# Patient Record
Sex: Female | Born: 1980 | Race: White | Hispanic: No | Marital: Single | Smoking: Never smoker
Health system: Southern US, Community
[De-identification: ages and names within clinical notes are randomized; demographics above are authoritative.]

## PROBLEM LIST (undated history)

## (undated) DIAGNOSIS — O99210 Obesity complicating pregnancy, unspecified trimester: Secondary | ICD-10-CM

## (undated) DIAGNOSIS — R87619 Unspecified abnormal cytological findings in specimens from cervix uteri: Secondary | ICD-10-CM

## (undated) DIAGNOSIS — J45909 Unspecified asthma, uncomplicated: Secondary | ICD-10-CM

## (undated) HISTORY — PX: GALLBLADDER SURGERY: SHX652

## (undated) HISTORY — PX: WISDOM TOOTH EXTRACTION: SHX21

## (undated) HISTORY — DX: Obesity complicating pregnancy, unspecified trimester: O99.210

## (undated) HISTORY — PX: DEBRIDMENT OF DECUBITUS ULCER: SHX6276

## (undated) HISTORY — DX: Unspecified abnormal cytological findings in specimens from cervix uteri: R87.619

---

## 2016-04-25 DIAGNOSIS — R87619 Unspecified abnormal cytological findings in specimens from cervix uteri: Secondary | ICD-10-CM

## 2016-04-25 HISTORY — DX: Unspecified abnormal cytological findings in specimens from cervix uteri: R87.619

## 2016-07-12 ENCOUNTER — Encounter: Payer: Self-pay | Admitting: Emergency Medicine

## 2016-07-12 ENCOUNTER — Emergency Department
Admission: EM | Admit: 2016-07-12 | Discharge: 2016-07-12 | Disposition: A | Discharge status: Home / Self Care | Payer: Self-pay | Attending: Emergency Medicine | Admitting: Emergency Medicine

## 2016-07-12 DIAGNOSIS — R112 Nausea with vomiting, unspecified: Secondary | ICD-10-CM

## 2016-07-12 DIAGNOSIS — R531 Weakness: Secondary | ICD-10-CM

## 2016-07-12 DIAGNOSIS — J45909 Unspecified asthma, uncomplicated: Secondary | ICD-10-CM | POA: Insufficient documentation

## 2016-07-12 DIAGNOSIS — N926 Irregular menstruation, unspecified: Secondary | ICD-10-CM | POA: Insufficient documentation

## 2016-07-12 DIAGNOSIS — R42 Dizziness and giddiness: Secondary | ICD-10-CM | POA: Insufficient documentation

## 2016-07-12 HISTORY — DX: Unspecified asthma, uncomplicated: J45.909

## 2016-07-12 LAB — URINALYSIS, COMPLETE (UACMP) WITH MICROSCOPIC
BACTERIA UA: NONE SEEN
BILIRUBIN URINE: NEGATIVE
GLUCOSE, UA: NEGATIVE mg/dL
HGB URINE DIPSTICK: NEGATIVE
Ketones, ur: NEGATIVE mg/dL
LEUKOCYTES UA: NEGATIVE
NITRITE: NEGATIVE
PH: 5 (ref 5.0–8.0)
Protein, ur: NEGATIVE mg/dL
RBC / HPF: NONE SEEN RBC/hpf (ref 0–5)
SPECIFIC GRAVITY, URINE: 1.021 (ref 1.005–1.030)

## 2016-07-12 LAB — COMPREHENSIVE METABOLIC PANEL
ALK PHOS: 34 U/L — AB (ref 38–126)
ALT: 16 U/L (ref 14–54)
AST: 19 U/L (ref 15–41)
Albumin: 3.8 g/dL (ref 3.5–5.0)
Anion gap: 5 (ref 5–15)
BUN: 11 mg/dL (ref 6–20)
CALCIUM: 9.1 mg/dL (ref 8.9–10.3)
CO2: 29 mmol/L (ref 22–32)
CREATININE: 0.65 mg/dL (ref 0.44–1.00)
Chloride: 105 mmol/L (ref 101–111)
Glucose, Bld: 105 mg/dL — ABNORMAL HIGH (ref 65–99)
Potassium: 4.2 mmol/L (ref 3.5–5.1)
Sodium: 139 mmol/L (ref 135–145)
Total Bilirubin: 1.2 mg/dL (ref 0.3–1.2)
Total Protein: 7.2 g/dL (ref 6.5–8.1)

## 2016-07-12 LAB — CBC
HEMATOCRIT: 38.9 % (ref 35.0–47.0)
Hemoglobin: 13.7 g/dL (ref 12.0–16.0)
MCH: 32 pg (ref 26.0–34.0)
MCHC: 35.3 g/dL (ref 32.0–36.0)
MCV: 90.5 fL (ref 80.0–100.0)
PLATELETS: 252 10*3/uL (ref 150–440)
RBC: 4.3 MIL/uL (ref 3.80–5.20)
RDW: 13.8 % (ref 11.5–14.5)
WBC: 9.1 10*3/uL (ref 3.6–11.0)

## 2016-07-12 LAB — HCG, QUANTITATIVE, PREGNANCY: hCG, Beta Chain, Quant, S: <1 m[IU]/mL (ref <5)

## 2016-07-12 MED ORDER — ONDANSETRON 4 MG PO TBDP: 4.0000 mg | ORAL_TABLET | Freq: Three times a day (TID) | ORAL | 0 refills | Status: DC | PRN

## 2016-07-12 MED ORDER — SODIUM CHLORIDE 0.9 % IV BOLUS (SEPSIS)
1000.0000 mL | Freq: Once | INTRAVENOUS | Status: AC
  Administered 2016-07-12: 1000 mL via INTRAVENOUS

## 2016-07-12 MED ORDER — ONDANSETRON HCL 4 MG/2ML IJ SOLN
4.0000 mg | Freq: Once | INTRAMUSCULAR | Status: AC
  Administered 2016-07-12: 4 mg via INTRAVENOUS
  Filled 2016-07-12: qty 2

## 2016-07-12 NOTE — ED Provider Notes (Signed)
[facility] Emergency Department Provider Note  ____________________________________________  Time seen: Approximately 11:07 AM  I have reviewed Darlene triage vital signs and Darlene nursing notes.   HISTORY  Chief Complaint Weakness    HPI Darlene Pena is a 36 y.o. female sexually active without contraception presenting with generalized weakness, lightheadedness, an episode of vomiting, and irregular periods. Darlene Pena reports that she is trying to conceive and has had irregular vaginal bleedingfor Darlene past several weeks. Today, she was driving to work when she had an episode of lightheadedness with mildly blurred vision and a single episode of vomiting. Afterwards, she was able to eat a BLT and drink Sprite without any difficulty. She has also had several days of diarrhea and feeling generally tired. No cough or cold symptoms, fever or chills, urinary symptoms.  She reports a negative pregnancy test at home several days ago.   Past Medical History:  Diagnosis Date  . Asthma     There are no active problems to display for this Pena.   History reviewed. No pertinent surgical history.    Allergies Latex  History reviewed. No pertinent family history.  Social History Social History  Substance Use Topics  . Smoking status: Never Smoker  . Smokeless tobacco: Never Used  . Alcohol use Yes    Review of Systems Constitutional: No fever/chills.As of lightheadedness without dizziness. Negative syncope. Eyes: Blurred vision, now resolved. ENT: No sore throat. No congestion or rhinorrhea. Cardiovascular: Denies chest pain. Denies palpitations. Respiratory: Denies shortness of breath.  No cough. Gastrointestinal: No abdominal pain.  No nausea, no vomiting.  No diarrhea.  No constipation. Genitourinary: Negative for dysuria. Irregular menses. Musculoskeletal: Negative for back pain. Skin: Negative for rash. Neurological: Negative for headaches. No  focal numbness, tingling or weakness.   10-point ROS otherwise negative.  ____________________________________________   PHYSICAL EXAM:  VITAL SIGNS: ED Triage Vitals  Enc Vitals Group     BP 07/12/16 0940 (!) 163/89     Pulse Rate 07/12/16 0940 98     Resp 07/12/16 0940 20     Temp 07/12/16 0940 98.1 F (36.7 C)     Temp Source 07/12/16 0940 Oral     SpO2 07/12/16 0940 100 %     Weight 07/12/16 0941 (!) 500 lb (226.8 kg)     Height 07/12/16 0941 5\' 6"  (1.676 m)     Head Circumference --      Peak Flow --      Pain Score 07/12/16 0941 7     Pain Loc --      Pain Edu? --      Excl. in GC? --     Constitutional: Alert and oriented.Her bili obese and chronically ill appearing but in no acute distress. Answers questions appropriately. Eyes: Conjunctivae are normal.  EOMI. No scleral icterus. Head: Atraumatic. Nose: No congestion/rhinnorhea. Mouth/Throat: Mucous membranes are moist.  Neck: No stridor.  Supple.  No JVD. No mengingismus. Cardiovascular: Normal rate, regular rhythm. No murmurs, rubs or gallops.  Respiratory: Normal respiratory effort.  No accessory muscle use or retractions. Lungs CTAB.  No wheezes, rales or ronchi. Gastrointestinal: Obese. Soft, nontender and nondistended.  No guarding or rebound.  No peritoneal signs. Musculoskeletal: No LE edema. No ttp in Darlene calves or palpable cords.  Negative Homan's sign. Neurologic:  A&Ox3.  Speech is clear.  Face and smile are symmetric.  EOMI.  Moves all extremities well. Skin:  Skin is warm, dry and intact. No rash noted.  Psychiatric: Mood and affect are normal. Speech and behavior are normal.  Normal judgement. ____________________________________________   LABS (all labs ordered are listed, but only abnormal results are displayed)  Labs Reviewed  URINALYSIS, COMPLETE (UACMP) WITH MICROSCOPIC - Abnormal; Notable for Darlene following:       Result Value   Color, Urine YELLOW (*)    APPearance CLEAR (*)    Squamous  Epithelial / LPF 0-5 (*)    All other components within normal limits  COMPREHENSIVE METABOLIC PANEL - Abnormal; Notable for Darlene following:    Glucose, Bld 105 (*)    Alkaline Phosphatase 34 (*)    All other components within normal limits  CBC  HCG, QUANTITATIVE, PREGNANCY   ____________________________________________  EKG  ED ECG REPORT I, Rockne Menghini, Darlene attending physician, personally viewed and interpreted this ECG.   Date: 07/12/2016  EKG Time: 953  Rate: 79  Rhythm: normal sinus rhythm  Axis: normal  Intervals:none  ST&T Change: No STEMI  ____________________________________________  RADIOLOGY  No results found.  ____________________________________________   PROCEDURES  Procedure(s) performed: None  Procedures  Critical Care performed: No ____________________________________________   INITIAL IMPRESSION / ASSESSMENT AND PLAN / ED COURSE  Pertinent labs & imaging results that were available during my care of Darlene Pena were reviewed by me and considered in my medical decision making (see chart for details).  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 We will rule out pregnancy by doing an hCG in her blood, urinalysis to evaluate for UTI, electrolyte abnormality, or anemia. Her EKG is reassuring with no evidence of arrhythmia or ischemia. She may have a GI illness given her recent diarrhea as well, but is tolerating both food and liquid at this time. Plan reevaluation for final disposition.  ____________________________________________  FINAL CLINICAL IMPRESSION(S) / ED DIAGNOSES  Final diagnoses:  Lightheadedness  Generalized weakness  Non-intractable vomiting with nausea, unspecified vomiting type  Irregular menses         NEW MEDICATIONS STARTED DURING THIS VISIT:  New  Prescriptions   ONDANSETRON (ZOFRAN ODT) 4 MG DISINTEGRATING TABLET    Take 1 tablet (4 mg total) by mouth every 8 (eight) hours as needed for nausea or vomiting.      Rockne Menghini, MD 07/12/16 1220

## 2016-07-12 NOTE — ED Triage Notes (Signed)
Pt to ed with c/o weakness and lethargy last night and today.  Pt states "I feel tired for no reason".  Also reports joint aches and dizziness today.  Denies unilateral weakness.

## 2016-07-12 NOTE — ED Notes (Signed)
Pt requesting to leave, MD informed, MD to bedside.

## 2016-07-12 NOTE — Discharge Instructions (Signed)
Please drink plenty of fluid to stay well hydrated and eat small, regular, healthy meals throughout the day.  Please make an appointment with your primary care physician for follow-up. If you continue to have irregular periods, please make an appointment with your gynecologist.  Return to the emergency department if you develop severe pain, lightheadedness or fainting, chest pain, shortness of breath, or any other symptoms concerning to you.

## 2016-09-21 ENCOUNTER — Encounter: Payer: Self-pay | Admitting: Emergency Medicine

## 2016-09-21 ENCOUNTER — Emergency Department: Payer: Self-pay

## 2016-09-21 ENCOUNTER — Emergency Department
Admission: EM | Admit: 2016-09-21 | Discharge: 2016-09-21 | Disposition: A | Discharge status: Home / Self Care | Payer: Self-pay | Attending: Emergency Medicine | Admitting: Emergency Medicine

## 2016-09-21 DIAGNOSIS — O039 Complete or unspecified spontaneous abortion without complication: Secondary | ICD-10-CM | POA: Insufficient documentation

## 2016-09-21 DIAGNOSIS — J45909 Unspecified asthma, uncomplicated: Secondary | ICD-10-CM | POA: Insufficient documentation

## 2016-09-21 LAB — URINALYSIS, COMPLETE (UACMP) WITH MICROSCOPIC
Bacteria, UA: NONE SEEN
Bilirubin Urine: NEGATIVE
GLUCOSE, UA: NEGATIVE mg/dL
Ketones, ur: NEGATIVE mg/dL
Leukocytes, UA: NEGATIVE
Nitrite: NEGATIVE
PROTEIN: NEGATIVE mg/dL
SPECIFIC GRAVITY, URINE: 1.023 (ref 1.005–1.030)
pH: 5 (ref 5.0–8.0)

## 2016-09-21 LAB — HCG, QUANTITATIVE, PREGNANCY: hCG, Beta Chain, Quant, S: 20249 m[IU]/mL — ABNORMAL HIGH (ref <5)

## 2016-09-21 NOTE — ED Notes (Signed)
Patient reports mild spotting this am at approx 0200. 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 Patient states previous miscarriages caused pain and cramping, but were 12 years ago. Patient denies any current pain/cramping. States she is positive that bleeding is not rectal, though noticed bleeding when she was urinating (unsure of whether it is vaginal or from urine for certain).

## 2016-09-21 NOTE — ED Triage Notes (Signed)
Patient presents to the ED with vaginal spotting.  Patient states she is approx. 5-[redacted] weeks pregnant based on her most recent Ultrasound. Patient denies any abdominal cramping.  Patient states, "I've had a miscarriage before, this feels different but I wanted to get it checked out anyway."  Patient is in no obvious distress at this time.

## 2016-09-21 NOTE — ED Provider Notes (Signed)
[facility] Emergency Department Provider Note       Time seen: ----------------------------------------- 7:53 AM on 09/21/2016 -----------------------------------------     I have reviewed the triage vital signs and the nursing notes.   HISTORY   Chief Complaint Vaginal Bleeding    HPI Darlene Pena is a 36 y.o. female who presents to the ED for vaginal spotting. Patient reports she is 5-[redacted] weeks pregnant based on her most recent ultrasound. She denies any abdominal cramping. She states she's had a miscarriage before and this feels different but she was to be checked anyway. Patient denies any pain currently.   Past Medical History:  Diagnosis Date  . Asthma     There are no active problems to display for this patient.   Past Surgical History:  Procedure Laterality Date  . CESAREAN SECTION     x3    Allergies Latex  Social History Social History  Substance Use Topics  . Smoking status: Never Smoker  . Smokeless tobacco: Never Used  . Alcohol use Yes    Review of Systems Constitutional: Negative for fever. Cardiovascular: Negative for chest pain. Respiratory: Negative for shortness of breath. Gastrointestinal: Negative for abdominal pain, vomiting and diarrhea. Genitourinary: Negative for dysuria.Positive for vaginal bleeding Musculoskeletal: Negative for back pain. Skin: Negative for rash. Neurological: Negative for headaches, focal weakness or numbness.  All systems negative/normal/unremarkable except as stated in the HPI  ____________________________________________   PHYSICAL EXAM:  VITAL SIGNS: ED Triage Vitals  Enc Vitals Group     BP 09/21/16 0745 (!) 151/85     Pulse Rate 09/21/16 0745 89     Resp 09/21/16 0745 18     Temp 09/21/16 0745 98.2 F (36.8 C)     Temp Source 09/21/16 0745 Oral     SpO2 09/21/16 0745 100 %     Weight 09/21/16 0745 (!) 476 lb (215.9 kg)     Height 09/21/16 0745 5\' 7"  (1.702 m)    Head Circumference --      Peak Flow --      Pain Score 09/21/16 0744 0     Pain Loc --      Pain Edu? --      Excl. in GC? --     Constitutional: Alert and oriented. Well appearing and in no distress.Extremely morbid obesity Eyes: Conjunctivae are normal. Normal extraocular movements. Cardiovascular: Normal rate, regular rhythm. No murmurs, rubs, or gallops. Respiratory: Normal respiratory effort without tachypnea nor retractions. Breath sounds are clear and equal bilaterally. No wheezes/rales/rhonchi. Gastrointestinal: Soft and nontender. Normal bowel sounds Musculoskeletal: Nontender with normal range of motion in extremities. No lower extremity tenderness nor edema. Neurologic:  Normal speech and language. No gross focal neurologic deficits are appreciated.  Skin:  Skin is warm, dry and intact. No rash noted. Psychiatric: Mood and affect are normal. Speech and behavior are normal.  ____________________________________________  ED COURSE:  Pertinent labs & imaging results that were available during my care of the patient were reviewed by me and considered in my medical decision making (see chart for details). Patient presents for vaginal bleeding and pregnancy, we will assess with labs and imaging as indicated. Clinical Course as of Sep 21 1029  Wed Sep 21, 2016  0906 HCG, Newman NickelsBeta Chain, Quant, S: (!) [id number] [JW]    Clinical Course User Index [JW] Emily FilbertWilliams, Jonathan E, MD   Procedures ____________________________________________   LABS (pertinent positives/negatives)  Labs Reviewed  HCG, QUANTITATIVE, PREGNANCY - Abnormal; Notable for the  following:       Result Value   hCG, Beta Chain, Quant, S 20,249 (*)    All other components within normal limits  URINALYSIS, COMPLETE (UACMP) WITH MICROSCOPIC - Abnormal; Notable for the following:    Color, Urine AMBER (*)    APPearance HAZY (*)    Hgb urine dipstick LARGE (*)    Squamous Epithelial / LPF 6-30 (*)    All other  components within normal limits    RADIOLOGY Images were viewed by me  Pregnancy ultrasound IMPRESSION: Findings meet definitive criteria for failed pregnancy. This follows SRU consensus guidelines: Diagnostic Criteria for Nonviable Pregnancy Early in the First Trimester. [facility] [phone]. ____________________________________________  FINAL ASSESSMENT AND PLAN  Failed pregnancy   Plan: Patient's labs and imaging were dictated above. Patient had presented for some vaginal bleeding with recent outpatient ultrasound. Ultrasound findings today were confirmatory for failed pregnancy. She will likely continue to have cramping and bleeding. She is referred to GYN for outpatient follow-up.   Emily Filbert, MD   Note: This note was generated in part or whole with voice recognition software. Voice recognition is usually quite accurate but there are transcription errors that can and very often do occur. I apologize for any typographical errors that were not detected and corrected.     Emily Filbert, MD 09/21/16 [id number]

## 2016-09-26 ENCOUNTER — Emergency Department
Admission: EM | Admit: 2016-09-26 | Discharge: 2016-09-26 | Disposition: A | Discharge status: Home / Self Care | Payer: Medicaid Other | Attending: Emergency Medicine | Admitting: Emergency Medicine

## 2016-09-26 ENCOUNTER — Emergency Department: Payer: Medicaid Other

## 2016-09-26 ENCOUNTER — Encounter: Payer: Self-pay | Admitting: *Deleted

## 2016-09-26 DIAGNOSIS — Z3A01 Less than 8 weeks gestation of pregnancy: Secondary | ICD-10-CM | POA: Diagnosis not present

## 2016-09-26 DIAGNOSIS — N9489 Other specified conditions associated with female genital organs and menstrual cycle: Secondary | ICD-10-CM | POA: Insufficient documentation

## 2016-09-26 DIAGNOSIS — O034 Incomplete spontaneous abortion without complication: Secondary | ICD-10-CM | POA: Diagnosis not present

## 2016-09-26 DIAGNOSIS — O209 Hemorrhage in early pregnancy, unspecified: Secondary | ICD-10-CM | POA: Diagnosis present

## 2016-09-26 DIAGNOSIS — J45909 Unspecified asthma, uncomplicated: Secondary | ICD-10-CM | POA: Insufficient documentation

## 2016-09-26 LAB — CBC WITH DIFFERENTIAL/PLATELET
Basophils Absolute: 0 10*3/uL (ref 0–0.1)
Basophils Relative: 0 %
Eosinophils Absolute: 0 10*3/uL (ref 0–0.7)
Eosinophils Relative: 0 %
HEMATOCRIT: 39.4 % (ref 35.0–47.0)
HEMOGLOBIN: 13.6 g/dL (ref 12.0–16.0)
LYMPHS ABS: 1.6 10*3/uL (ref 1.0–3.6)
LYMPHS PCT: 11 %
MCH: 31.8 pg (ref 26.0–34.0)
MCHC: 34.6 g/dL (ref 32.0–36.0)
MCV: 92.1 fL (ref 80.0–100.0)
MONO ABS: 0.7 10*3/uL (ref 0.2–0.9)
MONOS PCT: 5 %
NEUTROS ABS: 12.6 10*3/uL — AB (ref 1.4–6.5)
NEUTROS PCT: 84 %
Platelets: 252 10*3/uL (ref 150–440)
RBC: 4.28 MIL/uL (ref 3.80–5.20)
RDW: 13.5 % (ref 11.5–14.5)
WBC: 14.9 10*3/uL — ABNORMAL HIGH (ref 3.6–11.0)

## 2016-09-26 LAB — ABO/RH: ABO/RH(D): O POS

## 2016-09-26 LAB — HCG, QUANTITATIVE, PREGNANCY: hCG, Beta Chain, Quant, S: 9052 m[IU]/mL — ABNORMAL HIGH (ref <5)

## 2016-09-26 MED ORDER — OXYCODONE-ACETAMINOPHEN 5-325 MG PO TABS
2.0000 | ORAL_TABLET | Freq: Once | ORAL | Status: AC
  Administered 2016-09-26: 2 via ORAL

## 2016-09-26 MED ORDER — MORPHINE SULFATE (PF) 4 MG/ML IV SOLN
4.0000 mg | Freq: Once | INTRAVENOUS | Status: AC
  Administered 2016-09-26: 4 mg via INTRAMUSCULAR
  Filled 2016-09-26: qty 1

## 2016-09-26 MED ORDER — OXYCODONE-ACETAMINOPHEN 5-325 MG PO TABS
ORAL_TABLET | ORAL | Status: AC
  Filled 2016-09-26: qty 2

## 2016-09-26 MED ORDER — OXYCODONE-ACETAMINOPHEN 5-325 MG PO TABS: 1.0000 | ORAL_TABLET | ORAL | 0 refills | Status: DC | PRN

## 2016-09-26 MED ORDER — DOCUSATE SODIUM 100 MG PO CAPS: ORAL_CAPSULE | ORAL | 0 refills | Status: DC

## 2016-09-26 NOTE — ED Notes (Addendum)
Patient transported to Ultrasound 

## 2016-09-26 NOTE — ED Notes (Signed)
Pt refused PO medication, states she took 400 mg of tylenol before arrival, states "I want to wait for a shot"

## 2016-09-26 NOTE — ED Notes (Signed)
Dr York Ceriseforbach in room. Pt bleeding through pants. Given new clothes and wipe

## 2016-09-26 NOTE — ED Provider Notes (Signed)
[facility] Emergency Department Provider Note  ____________________________________________   None    (approximate)  I have reviewed the triage vital signs and the nursing notes.   HISTORY  Chief Complaint Vaginal Bleeding    HPI Darlene Pena is a 36 y.o. female [place] P4 Ab 2 at approx [redacted] weeks gestation who presents for evaluation of acute onset yesterday of severe abdominal cramping that are occurring about every minute and accompanied with heavy vaginal bleeding.  She was seen in this emergency department about 5 days ago for abdominal cramping and had an ultrasound that demonstrated early fetal demise.  She did not start having severe abdominal cramping or vaginal bleeding until yesterday.  It has been heavy and in the emergency department exam room she stood up and had a gush of blood that got all over the floor as well as all of her close.  She denies any lightheadedness or dizziness, chest pain, shortness of breath, nausea, vomiting.  She states that the cramping is very severe and she wants a shot to help the pain go away.  She did not follow up with an OB provider after the last ED visit although it appears in the computer that she has been to [facility] in the past.  She has no specific provider locally.Her bleeding and pain are both severe today.Nothing in particular makes the patient's symptoms better nor worse.     Past Medical History:  Diagnosis Date  . Asthma     There are no active problems to display for this patient.   Past Surgical History:  Procedure Laterality Date  . CESAREAN SECTION     x3    Prior to Admission medications   Medication Sig Start Date End Date Taking? Authorizing Provider  albuterol (PROVENTIL HFA;VENTOLIN HFA) 108 (90 Base) MCG/ACT inhaler Inhale into the lungs every 6 (six) hours as needed for wheezing or shortness of breath.    [provider]  docusate sodium (COLACE) 100 MG capsule Take 1 tablet once or  twice daily as needed for constipation while taking narcotic pain medicine 09/26/16   Hinda Kehr, MD  ondansetron (ZOFRAN ODT) 4 MG disintegrating tablet Take 1 tablet (4 mg total) by mouth every 8 (eight) hours as needed for nausea or vomiting. Patient not taking: Reported on 09/21/2016 07/12/16   Eula Listen, MD  oxyCODONE-acetaminophen (ROXICET) 5-325 MG tablet Take 1-2 tablets by mouth every 4 (four) hours as needed for severe pain. 09/26/16   Hinda Kehr, MD    Allergies Latex  History reviewed. No pertinent family history.  Social History Social History  Substance Use Topics  . Smoking status: Never Smoker  . Smokeless tobacco: Never Used  . Alcohol use Yes    Review of Systems Constitutional: No fever/chills Eyes: No visual changes. ENT: No sore throat. Cardiovascular: Denies chest pain. Respiratory: Denies shortness of breath. Gastrointestinal: Lower cramping abdominal pain.  No nausea, no vomiting.  No diarrhea.  No constipation. Genitourinary: Heavy vaginal bleeding starting yesterday, worse today. Negative for dysuria. Musculoskeletal: Negative for neck pain.  Negative for back pain. Integumentary: Negative for rash. Neurological: Negative for headaches, focal weakness or numbness.   ____________________________________________   PHYSICAL EXAM:  VITAL SIGNS: ED Triage Vitals  Enc Vitals Group     BP 09/26/16 1328 (!) 131/99     Pulse Rate 09/26/16 1328 (!) 103     Resp 09/26/16 1328 20     Temp 09/26/16 1328 98 F (36.7 C)  Temp Source 09/26/16 1328 Oral     SpO2 09/26/16 1328 95 %     Weight 09/26/16 1328 (!) 215.9 kg (476 lb)     Height 09/26/16 1328 1.702 m (5' 7")     Head Circumference --      Peak Flow --      Pain Score 09/26/16 1327 10     Pain Loc --      Pain Edu? --      Excl. in [place]? --     Constitutional: Alert and oriented. Generally well appearing but does appear uncomfortable Eyes: Conjunctivae are normal.  Head:  Atraumatic. Nose: No congestion/rhinnorhea. Cardiovascular: Normal rate, regular rhythm. Good peripheral circulation. Grossly normal heart sounds. Respiratory: Normal respiratory effort.  No retractions. Lungs CTAB. Gastrointestinal: Morbid obesity, about 475 lbs.  Soft and nontender.  Genitourinary: Exam limited by habitus.  Copious dark blood present in vagina, suctioned with Yankauer.  In spite of multiple attempts I was unable to fully visualize the cervix but I did remove a number of large clots.  After suctioning and continuing with the speculum exam, though I cannot specifically visualize the cervix, I see a small and relatively slow trickle of blood continuing to be expressed but it is not large volume or a rapid rate of bleeding. Musculoskeletal: No lower extremity tenderness nor edema. No gross deformities of extremities. Neurologic:  Normal speech and language. No gross focal neurologic deficits are appreciated.  Skin:  Skin is warm, dry and intact. No rash noted. Psychiatric: Mood and affect are normal. Speech and behavior are normal.  ____________________________________________   LABS (all labs ordered are listed, but only abnormal results are displayed)  Labs Reviewed  HCG, QUANTITATIVE, PREGNANCY - Abnormal; Notable for the following:       Result Value   hCG, Beta Chain, Quant, S 9,052 (*)    All other components within normal limits  CBC WITH DIFFERENTIAL/PLATELET - Abnormal; Notable for the following:    WBC 14.9 (*)    Neutro Abs 12.6 (*)    All other components within normal limits  ABO/RH   ____________________________________________  EKG  None - EKG not ordered by ED physician ____________________________________________  RADIOLOGY   US Ob Comp < 14 Wks  Result Date: 09/26/2016 CLINICAL DATA:  Heavy vaginal bleeding. Failed intrauterine pregnancy. EXAM: OBSTETRIC <14 WK [place] AND TRANSVAGINAL OB US TECHNIQUE: Both transabdominal and transvaginal ultrasound  examinations were performed for complete evaluation of the gestation as well as the maternal uterus, adnexal regions, and pelvic cul-de-sac. Transvaginal technique was performed to assess early pregnancy. COMPARISON:  09/21/2016 FINDINGS: Intrauterine gestational sac: Single irregular shaped sac now seen in lower uterine segment Yolk sac:  Not Visualized. Embryo:  Not Visualized. MSD: 21  mm   7 w   0  d Subchorionic hemorrhage:  None visualized. Maternal uterus/adnexae: Neither ovary directly visualized, however no adnexal mass or free fluid identified. IMPRESSION: Abnormal appearing intrauterine gestational sac now located in the lower uterine segment, consistent with incomplete spontaneous abortion. Electronically Signed   By: Earle Gell M.D.   On: 09/26/2016 18:07   US Ob Transvaginal  Result Date: 09/26/2016 CLINICAL DATA:  Heavy vaginal bleeding. Failed intrauterine pregnancy. EXAM: OBSTETRIC <14 WK [place] AND TRANSVAGINAL OB US TECHNIQUE: Both transabdominal and transvaginal ultrasound examinations were performed for complete evaluation of the gestation as well as the maternal uterus, adnexal regions, and pelvic cul-de-sac. Transvaginal technique was performed to assess early pregnancy. COMPARISON:  09/21/2016 FINDINGS: Intrauterine gestational  sac: Single irregular shaped sac now seen in lower uterine segment Yolk sac:  Not Visualized. Embryo:  Not Visualized. MSD: 21  mm   7 w   0  d Subchorionic hemorrhage:  None visualized. Maternal uterus/adnexae: Neither ovary directly visualized, however no adnexal mass or free fluid identified. IMPRESSION: Abnormal appearing intrauterine gestational sac now located in the lower uterine segment, consistent with incomplete spontaneous abortion. Electronically Signed   By: Earle Gell M.D.   On: 09/26/2016 18:07    ____________________________________________   PROCEDURES  Critical Care performed: No   Procedure(s) performed:    Procedures   ____________________________________________   INITIAL IMPRESSION / ASSESSMENT AND PLAN / ED COURSE  Pertinent labs & imaging results that were available during my care of the patient were reviewed by me and considered in my medical decision making (see chart for details).  The patient is Rh+ and does not require RhoGAM.  Her hCG has dropped from about 22,002 about 9000.  Her H&H are stable. Pelvic exam limited by body habitus, cannot fully visualize cervix (see exam below).  Will evaluate with ultrasound and reassess.   Clinical Course as of Sep 27 2218  Mon Sep 26, 2016  4562 Ultrasound shows incomplete spontaneous abortion.  Patient has been stable with normal vital signs and no tachycardia.  Continues to bleed a slight amount.  Continues to have what she describes as severe abdominal cramping.  I had a phone conversation with Dr. Amalia Hailey who is on for unassigned OB/GYN.  He recommended not using Cytotec since the patient is already having severe abdominal cramping.  He also encouraged me to encourage her that in the absence of an emergent condition which requires EMS transport, she should follow up at [facility] (where she has previously established care) and because [facility] does not have sufficient capabilities for a patient of her body habitus to safely perform the surgery and anesthesia that may be needed if she requires a D&C.  I passed this along to her but I stressed that if she feels there is an emergent condition for which she needs immediate treatment she should contact 911, but if it is a matter of following up as an outpatient or even going back in by private vehicle for reevaluation, she should try to go directly to [facility] for definitive care.  She states that she understands this.  I will give HER-2 Percocet now, a prescription for additional pain medicine, and strict return precautions.  [CF]    Clinical Course User Index [CF] Hinda Kehr, MD     ____________________________________________  FINAL CLINICAL IMPRESSION(S) / ED DIAGNOSES  Final diagnoses:  Incomplete spontaneous abortion     MEDICATIONS GIVEN DURING THIS VISIT:  Medications  morphine 4 MG/ML injection 4 mg (4 mg Intramuscular Given 09/26/16 1611)  oxyCODONE-acetaminophen (PERCOCET/ROXICET) 5-325 MG per tablet 2 tablet (2 tablets Oral Given 09/26/16 1837)     NEW OUTPATIENT MEDICATIONS STARTED DURING THIS VISIT:  Discharge Medication List as of 09/26/2016  6:48 PM    START taking these medications   Details  docusate sodium (COLACE) 100 MG capsule Take 1 tablet once or twice daily as needed for constipation while taking narcotic pain medicine, Print    oxyCODONE-acetaminophen (ROXICET) 5-325 MG tablet Take 1-2 tablets by mouth every 4 (four) hours as needed for severe pain., Starting Mon 09/26/2016, Print        Discharge Medication List as of 09/26/2016  6:48 PM      Discharge  Medication List as of 09/26/2016  6:48 PM       Note:  This document was prepared using Dragon voice recognition software and may include unintentional dictation errors.    Hinda Kehr, MD 09/26/16 2221

## 2016-09-26 NOTE — ED Triage Notes (Signed)
Pt states she was [redacted] weeks pregnant and was told she was going to have a miscarriage, states bleeding that began yesterday but states increased pain and clots today, pt moaning in triage

## 2016-09-26 NOTE — Discharge Instructions (Signed)
You have been seen in the Emergency Department (ED) for an incomplete spontaneous abortion (a miscarriage).  As we discussed, based on limitations at [facility], the local OB/GYN specialist recommended that you will receive the best possible care if you are able to follow up at [facility].  Please call them tomorrow at the number you have or at either of the numbers above to follow up with any of the available OB/GYN doctors or to visit their same day or walk-in clinic.  If you are experiencing an emergency, please call 911.  If you need follow-up or if you need to be seen in the emergency department but are stable and able to travel by private vehicle, please go directly to the [facility].  As a result of your blood type, you did not receive an injection of medication called Rhogam - please let your OB/Gyn know.  If you develop any other symptoms that concern you (including, but not limited to, persistent vomiting, worsening bleeding, abdominal or pelvic pain, or fever greater than 101), please return immediately to the Emergency Department.

## 2017-04-12 ENCOUNTER — Emergency Department: Payer: Medicaid Other

## 2017-04-12 ENCOUNTER — Encounter: Payer: Self-pay | Admitting: Emergency Medicine

## 2017-04-12 ENCOUNTER — Emergency Department
Admission: EM | Admit: 2017-04-12 | Discharge: 2017-04-12 | Disposition: A | Discharge status: Home / Self Care | Payer: Medicaid Other | Attending: Emergency Medicine | Admitting: Emergency Medicine

## 2017-04-12 DIAGNOSIS — Y92410 Unspecified street and highway as the place of occurrence of the external cause: Secondary | ICD-10-CM | POA: Diagnosis not present

## 2017-04-12 DIAGNOSIS — Y939 Activity, unspecified: Secondary | ICD-10-CM | POA: Insufficient documentation

## 2017-04-12 DIAGNOSIS — Y999 Unspecified external cause status: Secondary | ICD-10-CM | POA: Insufficient documentation

## 2017-04-12 DIAGNOSIS — S40022A Contusion of left upper arm, initial encounter: Secondary | ICD-10-CM | POA: Diagnosis not present

## 2017-04-12 DIAGNOSIS — S59912A Unspecified injury of left forearm, initial encounter: Secondary | ICD-10-CM | POA: Diagnosis present

## 2017-04-12 DIAGNOSIS — J45909 Unspecified asthma, uncomplicated: Secondary | ICD-10-CM | POA: Insufficient documentation

## 2017-04-12 MED ORDER — ACETAMINOPHEN 500 MG PO TABS
1000.0000 mg | ORAL_TABLET | Freq: Once | ORAL | Status: AC
  Administered 2017-04-12: 1000 mg via ORAL

## 2017-04-12 MED ORDER — ACETAMINOPHEN 500 MG PO TABS
ORAL_TABLET | ORAL | Status: AC
  Administered 2017-04-12: 1000 mg via ORAL
  Filled 2017-04-12: qty 2

## 2017-04-12 NOTE — Discharge Instructions (Signed)
Please keep your arm elevated above the level of your heart as much as possible.  You may apply ice for 15 minutes every 2 hours to decrease pain and swelling.  You may take Tylenol or Motrin for your pain.  Return to the emergency department if you develop severe pain, numbness tingling or weakness, or any other symptoms concerning to you.
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
## 2017-05-17 ENCOUNTER — Ambulatory Visit
Admission: RE | Admit: 2017-05-17 | Discharge: 2017-05-17 | Disposition: A | Discharge status: Home / Self Care | Payer: Medicaid Other | Source: Ambulatory Visit | Attending: Chiropractor | Admitting: Chiropractor

## 2017-05-17 ENCOUNTER — Other Ambulatory Visit: Payer: Self-pay | Admitting: Chiropractor

## 2017-05-17 DIAGNOSIS — M542 Cervicalgia: Secondary | ICD-10-CM | POA: Insufficient documentation

## 2017-08-22 ENCOUNTER — Encounter: Payer: Self-pay | Admitting: Maternal Newborn

## 2017-08-22 ENCOUNTER — Ambulatory Visit (INDEPENDENT_AMBULATORY_CARE_PROVIDER_SITE_OTHER): Payer: Medicaid Other | Admitting: Maternal Newborn

## 2017-08-22 VITALS — BP 120/90 | Wt >= 6400 oz

## 2017-08-22 DIAGNOSIS — O0990 Supervision of high risk pregnancy, unspecified, unspecified trimester: Secondary | ICD-10-CM

## 2017-08-22 DIAGNOSIS — O09529 Supervision of elderly multigravida, unspecified trimester: Secondary | ICD-10-CM | POA: Insufficient documentation

## 2017-08-22 DIAGNOSIS — Z6841 Body Mass Index (BMI) 40.0 and over, adult: Secondary | ICD-10-CM

## 2017-08-22 NOTE — Progress Notes (Signed)
C/O Does genetic testing need to be repeated since it was just done last year before miscarriage;

## 2017-08-22 NOTE — Progress Notes (Signed)
Ms. Begeman is a 37 year old [id number] presenting today for a NOB visit with a positive home pregnancy test, approximately 7w 2d gestation by LMP. Her pre-pregnancy BMI is 70.5.  After consultation with MD, I informed her that we unfortunately need to refer her to a higher level of care, as obstetric anesthesia services are not available at [facility] for patients with a BMI > 60. She desires a referral to [facility], which I have ordered.  Marcelyn Bruins, CNM

## 2017-08-23 ENCOUNTER — Encounter: Payer: Self-pay | Admitting: Maternal Newborn

## 2017-08-23 DIAGNOSIS — Z6841 Body Mass Index (BMI) 40.0 and over, adult: Secondary | ICD-10-CM | POA: Insufficient documentation

## 2017-08-24 ENCOUNTER — Other Ambulatory Visit: Payer: Self-pay | Admitting: Advanced Practice Midwife

## 2017-08-24 ENCOUNTER — Telehealth: Payer: Self-pay

## 2017-08-24 DIAGNOSIS — J45909 Unspecified asthma, uncomplicated: Secondary | ICD-10-CM

## 2017-08-24 DIAGNOSIS — O99519 Diseases of the respiratory system complicating pregnancy, unspecified trimester: Principal | ICD-10-CM

## 2017-08-24 MED ORDER — ALBUTEROL SULFATE HFA 108 (90 BASE) MCG/ACT IN AERS: 1.0000 | INHALATION_SPRAY | Freq: Four times a day (QID) | RESPIRATORY_TRACT | 1 refills | Status: AC | PRN

## 2017-08-24 NOTE — Telephone Encounter (Signed)
Pt states we transferred her out to [facility]. She is wondering if we could call in her Albuterol inhaler to the [address]. CB# [phone]

## 2017-08-24 NOTE — Telephone Encounter (Signed)
Pt aware.

## 2017-08-24 NOTE — Progress Notes (Signed)
Rx albuterol inhaler sent to patient pharmacy

## 2017-08-24 NOTE — Telephone Encounter (Signed)
Please let patient know that her prescription has been sent to her pharmacy. Thanks
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
## 2021-10-22 ENCOUNTER — Ambulatory Visit (LOCAL_COMMUNITY_HEALTH_CENTER): Payer: Self-pay

## 2021-10-22 DIAGNOSIS — Z111 Encounter for screening for respiratory tuberculosis: Secondary | ICD-10-CM

## 2021-10-25 ENCOUNTER — Other Ambulatory Visit: Payer: Self-pay

## 2021-10-25 ENCOUNTER — Ambulatory Visit (LOCAL_COMMUNITY_HEALTH_CENTER): Payer: Medicaid Other

## 2021-10-25 DIAGNOSIS — Z111 Encounter for screening for respiratory tuberculosis: Secondary | ICD-10-CM

## 2021-10-25 LAB — TB SKIN TEST
Induration: 0 mm
TB Skin Test: NEGATIVE
